# Patient Record
Sex: Male | Born: 1980 | Marital: Single | Smoking: Never smoker
Health system: Southern US, Community
[De-identification: ages and names within clinical notes are randomized; demographics above are authoritative.]

---

## 2013-05-31 ENCOUNTER — Ambulatory Visit
Admission: RE | Admit: 2013-05-31 | Discharge: 2013-05-31 | Disposition: A | Discharge status: Home / Self Care | Payer: No Typology Code available for payment source | Source: Ambulatory Visit | Attending: Infectious Disease | Admitting: Infectious Disease

## 2013-05-31 ENCOUNTER — Other Ambulatory Visit: Payer: Self-pay | Admitting: Infectious Disease

## 2013-05-31 DIAGNOSIS — A150 Tuberculosis of lung: Secondary | ICD-10-CM

## 2014-04-04 ENCOUNTER — Ambulatory Visit
Admission: RE | Admit: 2014-04-04 | Discharge: 2014-04-04 | Disposition: A | Discharge status: Home / Self Care | Payer: No Typology Code available for payment source | Source: Ambulatory Visit | Attending: Infectious Disease | Admitting: Infectious Disease

## 2014-04-04 ENCOUNTER — Other Ambulatory Visit: Payer: Self-pay | Admitting: Infectious Disease

## 2014-04-04 DIAGNOSIS — A150 Tuberculosis of lung: Secondary | ICD-10-CM

## 2014-05-09 ENCOUNTER — Emergency Department (HOSPITAL_COMMUNITY)
Admission: EM | Admit: 2014-05-09 | Discharge: 2014-05-09 | Disposition: A | Discharge status: Home / Self Care | Payer: Self-pay | Attending: Emergency Medicine | Admitting: Emergency Medicine

## 2014-05-09 ENCOUNTER — Encounter (HOSPITAL_COMMUNITY): Payer: Self-pay | Admitting: *Deleted

## 2014-05-09 ENCOUNTER — Emergency Department (HOSPITAL_COMMUNITY): Payer: Self-pay

## 2014-05-09 DIAGNOSIS — M25461 Effusion, right knee: Secondary | ICD-10-CM | POA: Insufficient documentation

## 2014-05-09 DIAGNOSIS — M25462 Effusion, left knee: Secondary | ICD-10-CM | POA: Insufficient documentation

## 2014-05-09 DIAGNOSIS — R52 Pain, unspecified: Secondary | ICD-10-CM

## 2014-05-09 MED ORDER — HYDROCODONE-ACETAMINOPHEN 5-325 MG PO TABS: 1.0000 | ORAL_TABLET | Freq: Four times a day (QID) | ORAL | Status: AC | PRN

## 2014-05-09 NOTE — Discharge Instructions (Signed)
Derrame en la rodilla (Knee Effusion)  Un derrame en la rodilla significa que hay lquido en la misma. Puede ser que tenga dificultad para doblarla y [place]. CUIDADOS EN EL HOGAR  Utilice las muletas segn las indicaciones del mdico.  Coloque hielo sobre la zona lesionada.  Ponga el hielo en una bolsa plstica.  Colquese una toalla entre la piel y la bolsa de hielo.  Deje el hielo durante 15 a 20 minutos, 3 a 4 veces por da.  No permanezca de pie durante mucho tiempo.  Tome slo la medicacin que le indic el mdico.  Puede necesitar hacer ejercicios de fortalecimiento. Consulte a su mdico.  Siga con la dieta y las [address] Northactividades normales, segn las indicaciones del mdico. SOLICITE AYUDA DE INMEDIATO SI:  Observa ms hinchazn (inflamacin) en la rodilla.  Presenta enrojecimiento, hinchazn o aumento del dolor.  La temperatura oral le sube a ms de 38,9 C (102 F).  Aparece una erupcin cutnea.  Tiene dificultades respiratorias.  Presenta alguna reaccin a los medicamentos que toma.  Siente mucho dolor al mover la rodilla. ASEGRESE DE QUE:  Comprende estas instrucciones.  Controlar su enfermedad.  Solicitar ayuda de inmediato si no mejora o empeora. Document Released: 08/03/2010 Document Revised: 07/11/2011 [facility] Patient Information 2015 [place], [place]. This information is not intended to replace advice given to you by your health care provider. Make sure you discuss any questions you have with your health care provider.

## 2014-05-09 NOTE — ED Notes (Signed)
PT DOES NOT SPEAK ENGLISH.

## 2014-05-09 NOTE — ED Notes (Signed)
Patient with pain both knees.  Patient denies trauma.  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  Patient does spend a lot of time working with knees bent

## 2014-05-09 NOTE — ED Provider Notes (Signed)
CSN: [id number]     Arrival date & time 05/09/14  0935 History  This chart was scribed for non-physician practitioner, Teressa LowerVrinda Oveta Idris, NP-C working with Arby BarretteMarcy Pfeiffer, MD by Luisa DagoPriscilla Tutu, ED scribe. This patient was seen in room TR06C/TR06C and the patient's care was started at 9:45 AM.    Chief Complaint  Patient presents with  . Knee Pain   The history is provided by the patient. A language interpreter was used.   HPI Comments: Jose Gonzalez is a 34 y.o. male who presents to the Emergency Department complaining of gradual onset bilateral knee swelling that started approximately 2 days ago. He is also complaining of associated pain. Denies taking any pills for pain but he does endorse applying a topical cream to bilateral knees. However, the cream did not provide him with any substantial relief. 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 Denies any pertinent medical hx, tobacco use, numbness, chills, fever, nausea, or abdominal pain.   History reviewed. No pertinent past medical history. History reviewed. No pertinent past surgical history. No family history on file. History  Substance Use Topics  . Smoking status: Never Smoker   . Smokeless tobacco: Not on file  . Alcohol Use: Yes    Review of Systems  Constitutional: Negative for fever and chills.  HENT: Negative for congestion.   Respiratory: Negative for shortness of breath.   Cardiovascular: Negative for chest pain.  Musculoskeletal: Positive for joint swelling and arthralgias.  Neurological: Negative for weakness and numbness.  All other systems reviewed and are negative.     Allergies  Review of patient's allergies indicates no known allergies.  Home Medications   Prior to Admission medications   Not on File   Triage Vitals:BP 152/90 mmHg  Pulse 92  Temp(Src) 99.2 F (37.3 C) (Oral)  Resp 18  SpO2 100%    Physical Exam  Constitutional: He is oriented to person,  place, and time. He appears well-developed and well-nourished. No distress.  HENT:  Head: Normocephalic and atraumatic.  Eyes: Conjunctivae and EOM are normal.  Neck: Neck supple. No tracheal deviation present.  Cardiovascular: Normal rate.   Pulmonary/Chest: Effort normal. No respiratory distress.  Musculoskeletal: Normal range of motion.  Full rom of knees bilaterally. No redness or warmth noted. Swelling noted.  Neurological: He is alert and oriented to person, place, and time.  Skin: Skin is warm and dry.  Psychiatric: He has a normal mood and affect. His behavior is normal.  Nursing note and vitals reviewed.   ED Course  Procedures (including critical care time)  DIAGNOSTIC STUDIES: Oxygen Saturation is 100% on RA, normal by my interpretation.    COORDINATION OF CARE: 9:58 AM-Will order X-rays of bilateral knees. Pt advised of plan for treatment and pt agrees.  Labs Review Labs Reviewed - No data to display  Imaging Review No results found.   EKG Interpretation None      MDM   Final diagnoses:  Pain  Bilateral knee effusions    Pt has full rom. Likely related to constant abuse of knees. Will give script for vicodin and have follow up with ortho  I personally performed the services described in this documentation, which was scribed in my presence. The recorded information has been reviewed and is accurate.    Teressa LowerVrinda Xzavion Doswell, NP 05/09/14 1105  Arby BarretteMarcy Pfeiffer, MD 05/11/14 [phone]

## 2015-05-15 DIAGNOSIS — M25461 Effusion, right knee: Secondary | ICD-10-CM | POA: Insufficient documentation

## 2015-05-15 DIAGNOSIS — M25561 Pain in right knee: Secondary | ICD-10-CM | POA: Insufficient documentation

## 2015-05-16 ENCOUNTER — Emergency Department (HOSPITAL_COMMUNITY)
Admission: EM | Admit: 2015-05-16 | Discharge: 2015-05-16 | Disposition: A | Discharge status: Home / Self Care | Payer: Self-pay | Attending: Emergency Medicine | Admitting: Emergency Medicine

## 2015-05-16 ENCOUNTER — Encounter (HOSPITAL_COMMUNITY): Payer: Self-pay | Admitting: *Deleted

## 2015-05-16 ENCOUNTER — Emergency Department (HOSPITAL_COMMUNITY): Payer: Self-pay

## 2015-05-16 DIAGNOSIS — M25561 Pain in right knee: Secondary | ICD-10-CM

## 2015-05-16 MED ORDER — IBUPROFEN 800 MG PO TABS: 800.0000 mg | ORAL_TABLET | Freq: Three times a day (TID) | ORAL | Status: AC

## 2015-05-16 MED ORDER — ACETAMINOPHEN 325 MG PO TABS
650.0000 mg | ORAL_TABLET | Freq: Once | ORAL | Status: AC
  Administered 2015-05-16: 650 mg via ORAL
  Filled 2015-05-16: qty 2

## 2015-05-16 MED ORDER — IBUPROFEN 400 MG PO TABS
800.0000 mg | ORAL_TABLET | Freq: Once | ORAL | Status: AC
  Administered 2015-05-16: 800 mg via ORAL
  Filled 2015-05-16: qty 2

## 2015-05-16 NOTE — Discharge Instructions (Signed)
Dolor de rodilla (Knee Pain) El dolor de rodilla es un sntoma muy comn y puede tener muchas causas. Suele desaparecer cuando se siguen las indicaciones del mdico en lo que respecta a aliviar el dolor y las molestias en la casa. Sin embargo, puede progresar hasta convertirse en una afeccin que requiere tratamiento. Algunas afecciones pueden incluir lo siguiente:  Artritis por uso y desgaste (artrosis).  Artritis por hinchazn e irritacin (artritis reumatoide o gota).  Un quiste o un crecimiento en la rodilla.  Una infeccin en la articulacin de la rodilla.  Una lesin que no se cura.  Dao, hinchazn o irritacin de los tejidos que sostienen la rodilla (distensin de ligamentos o tendinitis). Si el dolor de rodilla persiste, tal vez haya que realizar ms estudios para diagnosticar la afeccin, los cuales pueden incluir radiografas u otros estudios de diagnstico por imgenes de la rodilla. Adems, es posible que haya que extraerle lquido de la rodilla. El tratamiento del dolor continuo de rodilla depende de la causa, pero puede incluir lo siguiente:  Medicamentos para aliviar el dolor o reducir la hinchazn.  Inyecciones de corticoides en la rodilla.  Fisioterapia.  Ciruga. INSTRUCCIONES PARA EL CUIDADO EN EL HOGAR  Tome los medicamentos solamente como se lo haya indicado el mdico.  Mantenga la rodilla en reposo y en alto (elevada) mientras est descansando.  No haga cosas que le causen dolor o que lo intensifiquen.  Evite las actividades o los ejercicios de alto impacto, como correr, saltar la soga o hacer saltos de tijera.  Aplique hielo sobre la zona de la rodilla:  Ponga el hielo en una bolsa plstica.  Coloque una toalla entre la piel y la bolsa de hielo.  Coloque el hielo durante 20 minutos, 2 a 3 veces por da.  Pregntele al mdico si debe usar una rodillera elstica.  Cuando duerma, pngase una almohada debajo de la rodilla.  Baje de peso si es  necesario. El peso extra puede generar presin en la rodilla.  No consuma ningn producto que contenga tabaco, lo que incluye cigarrillos, tabaco de mascar o cigarrillos electrnicos. Si necesita ayuda para dejar de fumar, consulte al mdico. Fumar puede retrasar la curacin de cualquier problema que tenga en el hueso y la articulacin. SOLICITE ATENCIN MDICA SI:  El dolor de rodilla contina, cambia o empeora.  Tiene fiebre junto con dolor de rodilla.  La rodilla se le tuerce o se le traba.  La rodilla est ms hinchada. SOLICITE ATENCIN MDICA DE INMEDIATO SI:   La articulacin de la rodilla est caliente al tacto.  Tiene dolor en el pecho o dificultad para respirar.   Esta informacin no tiene como fin reemplazar el consejo del mdico. Asegrese de hacerle al mdico cualquier pregunta que tenga.   Document Released: 10/05/2007 Document Revised: 05/09/2014 Elsevier Interactive Patient Education 2016 Elsevier Inc.  

## 2015-05-16 NOTE — ED Notes (Signed)
The pt is c/o rt knee pain and swelling for 3 days.  No known injury.  He has had fluid drawn off the knee in the past.  He has an appointment with a ortho doctor on Monday but his knee hurt too much to wait.

## 2015-05-16 NOTE — ED Provider Notes (Signed)
CSN: [id number]     Arrival date & time 05/15/15  2355 History   First MD Initiated Contact with Patient 05/16/15 0007     Chief Complaint  Patient presents with  . Knee Pain     (Consider location/radiation/quality/duration/timing/severity/associated sxs/prior Treatment) HPI   Jose Gonzalez is a 35 y.o. male with no significant PMH who presents with 3 day hx of constant, moderate, gradually worsening right knee pain and swelling.  He reports taking 6-8 tablets of Advil throughout the day.  Aggravating factors include bearing weight.  No prior injury in the past.  No injury/trauma.  He reports he has had fluid drawn off the knee in the past.  He reports he has an appointment on Monday with his orthopedic doctor.  Denies numbness, paresthesias, or weakness.  No fevers or chills.    History reviewed. No pertinent past medical history. History reviewed. No pertinent past surgical history. No family history on file. Social History  Substance Use Topics  . Smoking status: Never Smoker   . Smokeless tobacco: None  . Alcohol Use: Yes    Review of Systems  All other systems negative unless otherwise stated in HPI   Allergies  Review of patient's allergies indicates no known allergies.  Home Medications   Prior to Admission medications   Medication Sig Start Date End Date Taking? Authorizing Provider  HYDROcodone-acetaminophen (NORCO/VICODIN) 5-325 MG per tablet Take 1-2 tablets by mouth every 6 (six) hours as needed. 05/09/14   Teressa Lower, NP  ibuprofen (ADVIL,MOTRIN) 800 MG tablet Take 1 tablet (800 mg total) by mouth 3 (three) times daily. 05/16/15   Jess Toney, PA-C   BP 151/98 mmHg  Pulse 84  Temp(Src) 99.7 F (37.6 C)  Resp 16  SpO2 97% Physical Exam  Constitutional: He is oriented to person, place, and time. He appears well-developed and well-nourished.  HENT:  Head: Atraumatic.  Eyes: Conjunctivae are normal. No scleral icterus.  Neck: No tracheal deviation present.   Cardiovascular:  Pulses:      Dorsalis pedis pulses are 2+ on the right side, and 2+ on the left side.  Pulmonary/Chest: Effort normal. No respiratory distress.  Musculoskeletal: He exhibits edema and tenderness.       Right knee: He exhibits decreased range of motion (secondary to pain) and swelling. He exhibits no effusion, no ecchymosis, no deformity and no erythema. Tenderness found.       Left knee: Normal.  Right knee: No erythema or warmth.  Decreased ROM secondary to pain.  Swelling noted.   Neurological: He is alert and oriented to person, place, and time.  Strength and sensation intact bilaterally throughout lower extremities.   Skin: Skin is warm and dry.  Psychiatric: He has a normal mood and affect. His behavior is normal.    ED Course  Procedures (including critical care time) Labs Review Labs Reviewed - No data to display  Imaging Review Dg Knee Complete 4 Views Right  05/16/2015  CLINICAL DATA:  35 year old male with right knee pain unable to straighten the leg EXAM: RIGHT KNEE - COMPLETE 4+ VIEW COMPARISON:  Radiograph dated 05/09/2014 FINDINGS: There is no evidence of fracture, dislocation, or joint effusion. There is no evidence of arthropathy or other focal bone abnormality. Soft tissues are unremarkable. IMPRESSION: Negative. Electronically Signed   By: Elgie Collard M.D.   On: 05/16/2015 01:23   I have personally reviewed and evaluated these images and lab results as part of my medical decision-making.   EKG Interpretation  None      MDM   Final diagnoses:  Right knee pain   Patient presents with right knee pain x 3 days.  No injury/trauma.  Assoc swelling.  NVI.  Right knee with swelling.  Will give motrin and tylenol for pain.  Plain films show no acute abnormality. Will give knee immobilizer and crutches. Recommend follow up appointment on Monday with his orthopedist. Dicussed return precautions.  Patient agrees and acknowledges the above plan for  discharge.  Filed Vitals:   05/16/15 0012  BP: 151/98  Pulse: 84  Temp: 99.7 F (37.6 C)  Resp: 16    Meds given in ED:  Medications  ibuprofen (ADVIL,MOTRIN) tablet 800 mg (not administered)  acetaminophen (TYLENOL) tablet 650 mg (not administered)    New Prescriptions   IBUPROFEN (ADVIL,MOTRIN) 800 MG TABLET    Take 1 tablet (800 mg total) by mouth 3 (three) times daily.        Cheri FowlerKayla Nikeisha Klutz, PA-C 05/16/15 0201  Devoria AlbeIva Knapp, MD 05/16/15 [phone]

## 2017-03-03 IMAGING — CR DG KNEE COMPLETE 4+V*R*
4 series · 4 of 4 positions shown · non-contrast
Comparison: Radiograph dated 05/09/2014

CLINICAL DATA: 34-year-old male with right knee pain unable to
straighten the leg

EXAM:
RIGHT KNEE - COMPLETE 4+ VIEW

[knee ap]
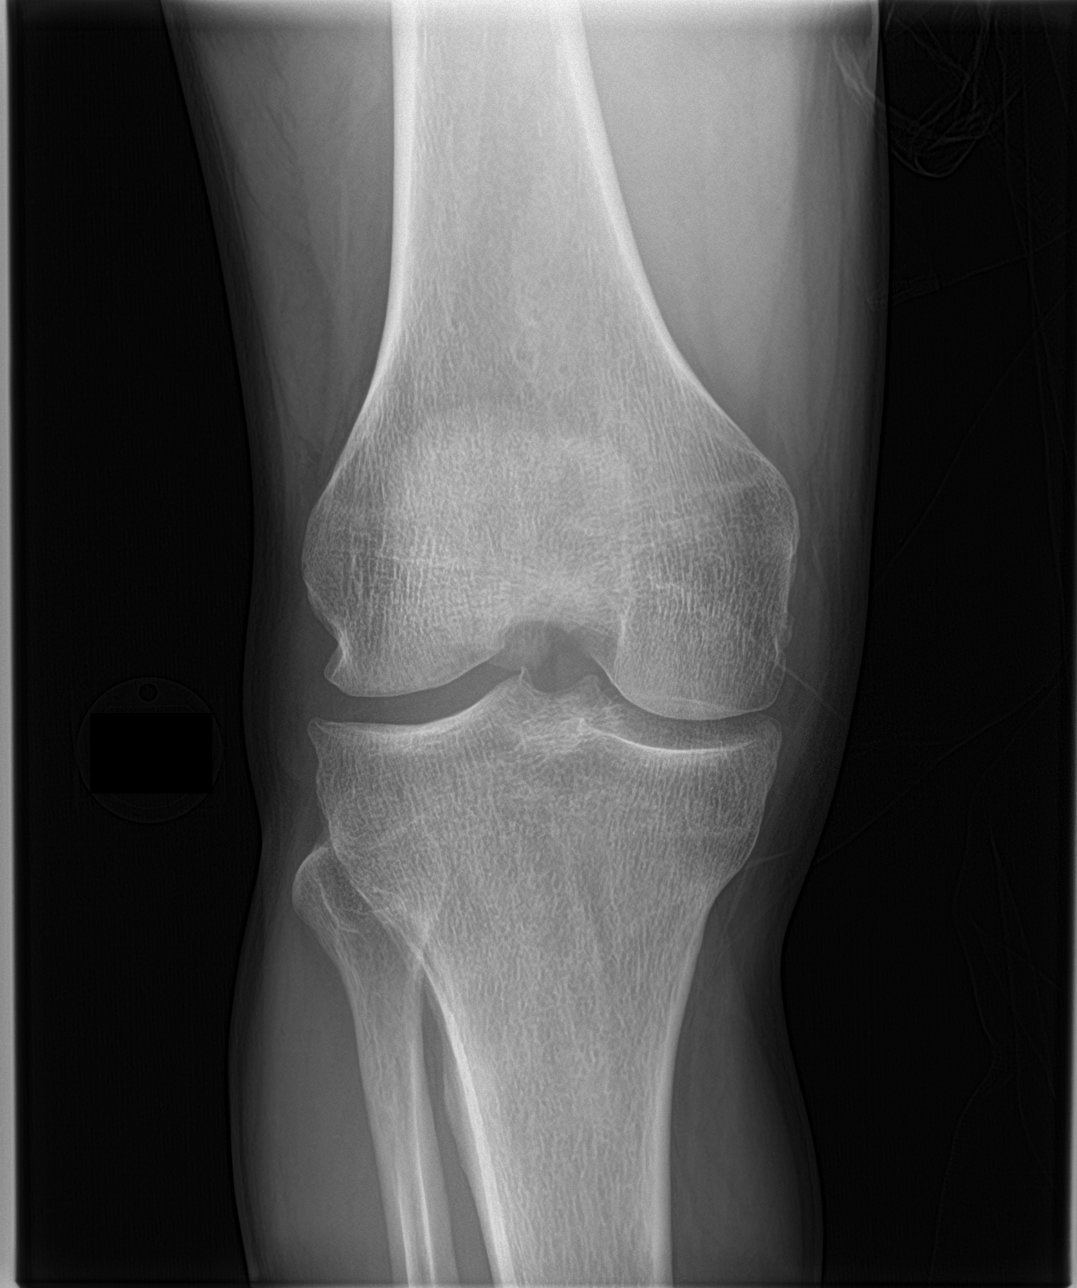

[knee lat]
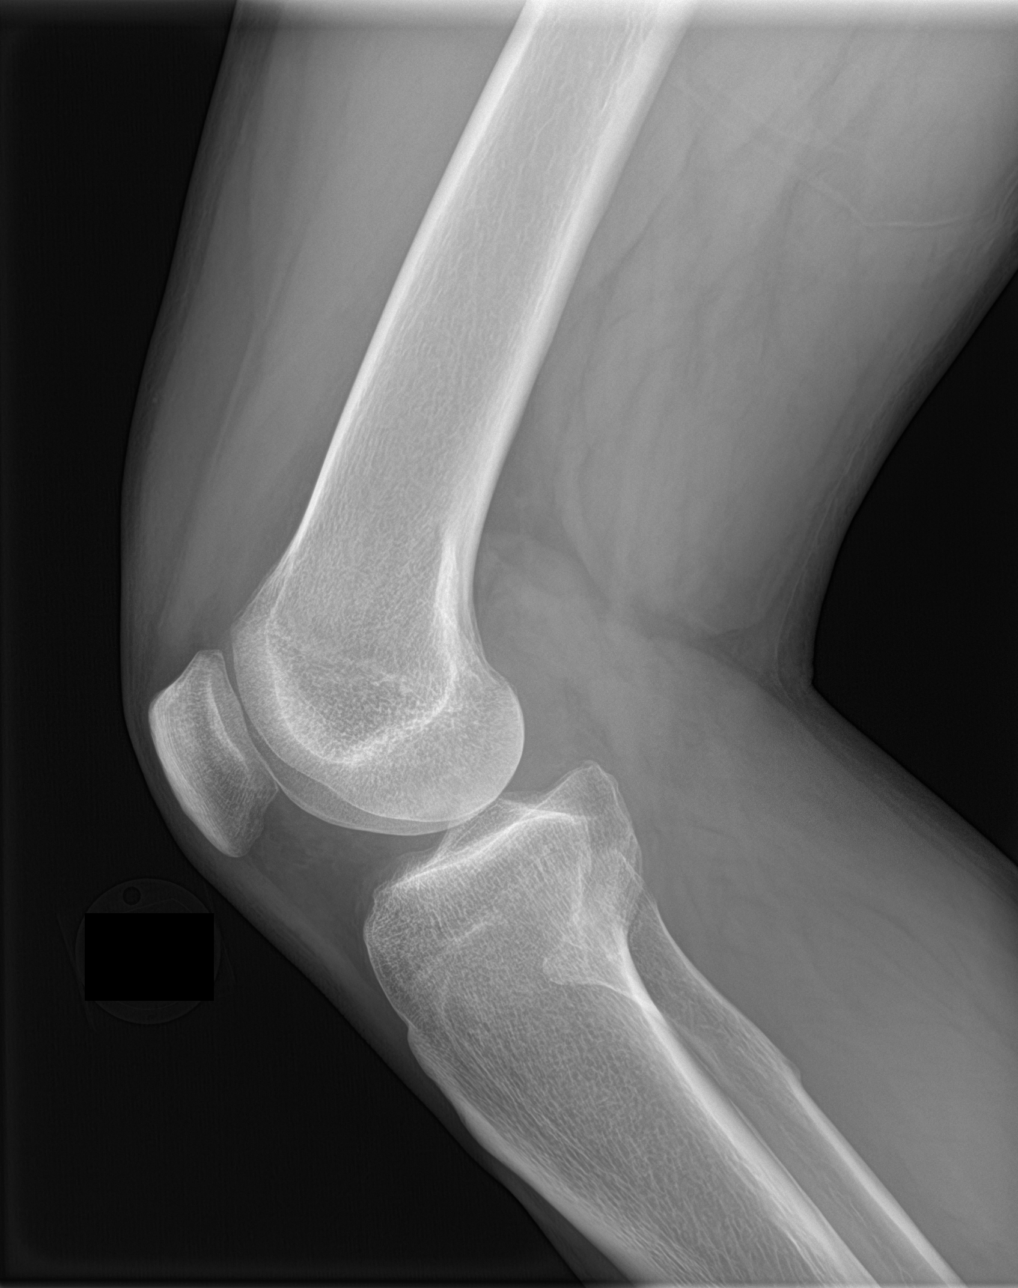

[knee obl (1 of 2)]
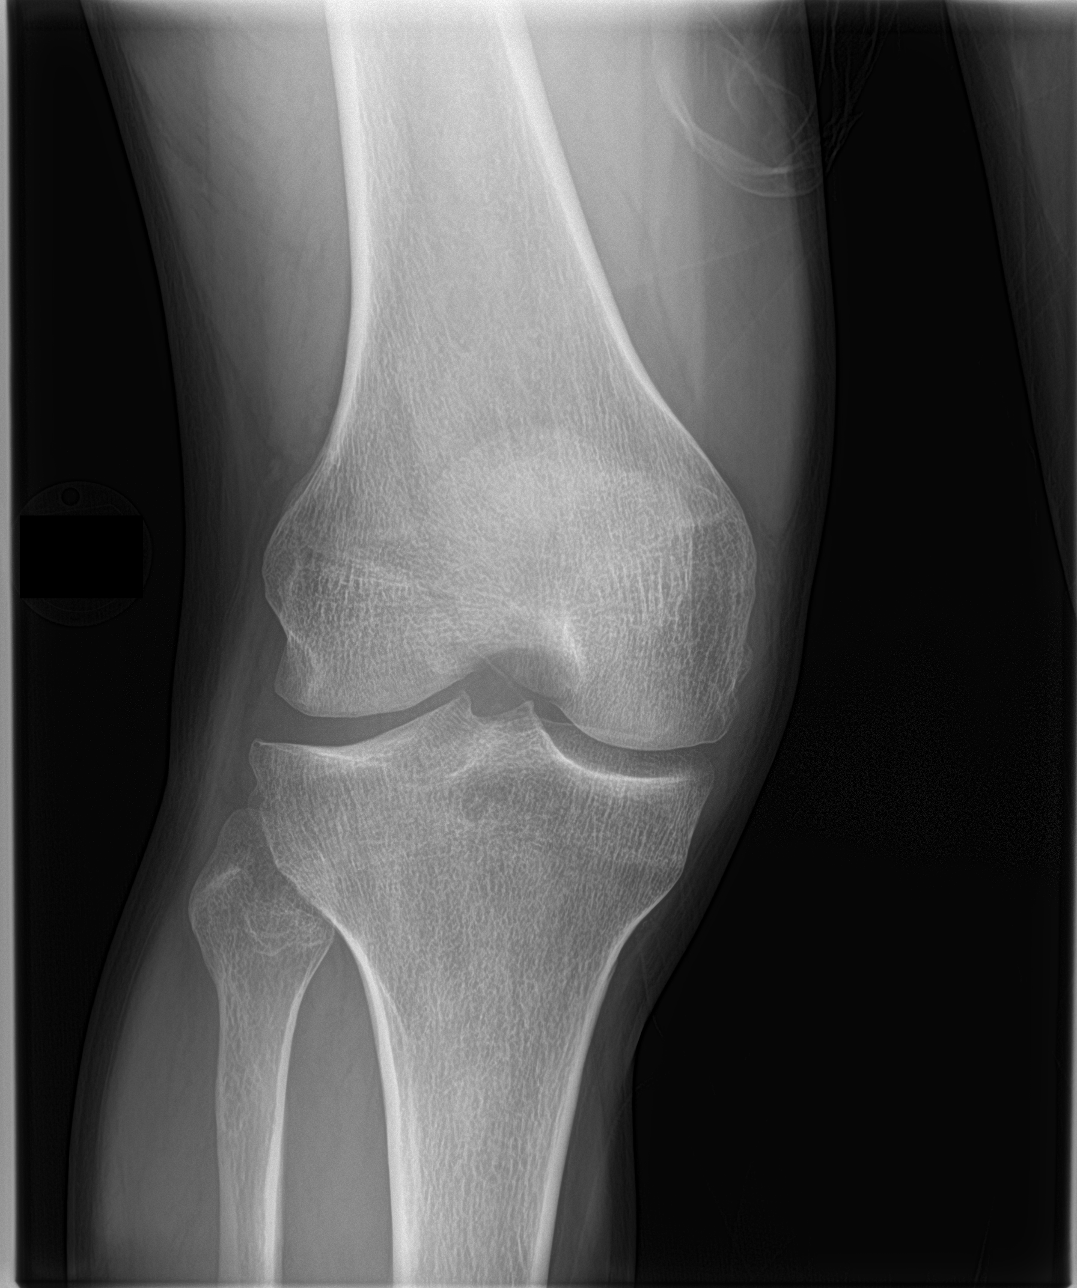

[knee obl (2 of 2)]
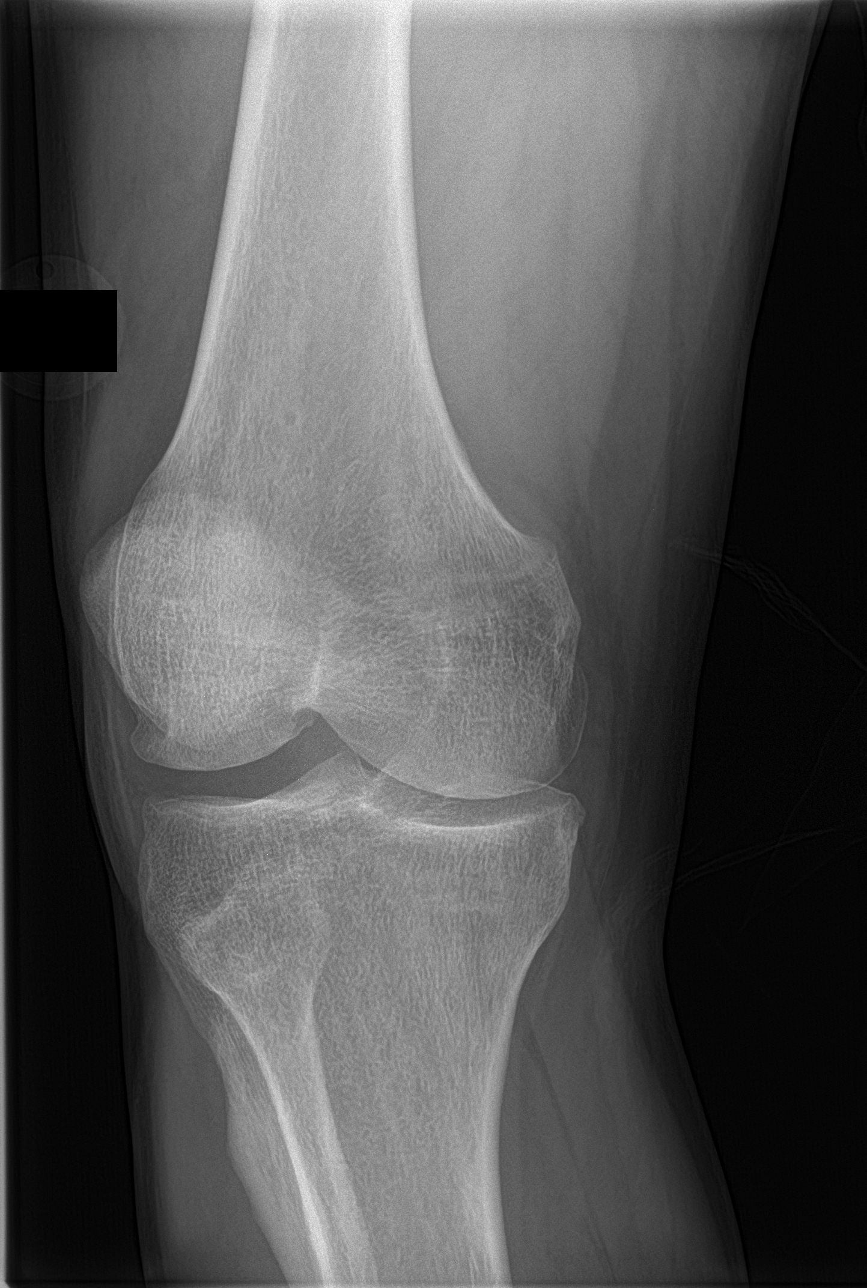

[4 of 4 positions shown; findings below may reference images not displayed]

FINDINGS: There is no evidence of fracture, dislocation, or joint effusion.
There is no evidence of arthropathy or other focal bone abnormality.
Soft tissues are unremarkable.
IMPRESSION: Negative.

## 2022-12-10 ENCOUNTER — Emergency Department (HOSPITAL_COMMUNITY): Payer: Self-pay

## 2022-12-10 ENCOUNTER — Emergency Department (HOSPITAL_COMMUNITY)
Admission: EM | Admit: 2022-12-10 | Discharge: 2022-12-10 | Disposition: A | Discharge status: Home / Self Care | Payer: Self-pay | Attending: Emergency Medicine | Admitting: Emergency Medicine

## 2022-12-10 ENCOUNTER — Other Ambulatory Visit: Payer: Self-pay

## 2022-12-10 DIAGNOSIS — G8929 Other chronic pain: Secondary | ICD-10-CM | POA: Insufficient documentation

## 2022-12-10 DIAGNOSIS — M25571 Pain in right ankle and joints of right foot: Secondary | ICD-10-CM | POA: Insufficient documentation

## 2022-12-10 MED ORDER — INDOMETHACIN 50 MG PO CAPS: 50.0000 mg | ORAL_CAPSULE | Freq: Two times a day (BID) | ORAL | 0 refills | Status: AC

## 2022-12-10 NOTE — Discharge Instructions (Addendum)
Use the anti-inflammatories for pain control.  Follow-up with the sports doctor if your ankle continues to hurt.

## 2022-12-10 NOTE — ED Notes (Signed)
Pt ambulated to hall bed with visitor independently and in NAD

## 2022-12-10 NOTE — ED Triage Notes (Signed)
Pt came in via POV d/t Rt leg pain for the past few days from his knee down to his foot. A/Ox4, rates pain 10/10, denies any recent falls or injuries.

## 2022-12-10 NOTE — ED Provider Notes (Signed)
[place] EMERGENCY DEPARTMENT AT [facility] Provider Note   CSN: [id number] Arrival date & time: 12/10/22  1322     History  Chief Complaint  Patient presents with   Leg Pain    Jose Gonzalez is a 42 y.o. male.  Patient is a 42 year old male with no known medical history who is presenting today with ongoing pain in his right ankle.  For over 10 years now he has had waxing and waning pain in this ankle.  He denies any known injury in the past.  This time his ankle has been hurting for multiple days and has been mildly swollen.  He has in the past been told he had gout and he had been on some medication for a while but is not on anything consistently.  He denies any recent trauma.  No pain in his toes foot but sometimes the pain in the ankle radiates up the leg.  The history is provided by the patient. The history is limited by a language barrier. A language interpreter was used.  Leg Pain      Home Medications Prior to Admission medications   Medication Sig Start Date End Date Taking? Authorizing Provider  HYDROcodone-acetaminophen (NORCO/VICODIN) 5-325 MG per tablet Take 1-2 tablets by mouth every 6 (six) hours as needed. 05/09/14   Teressa Lower, NP  ibuprofen (ADVIL,MOTRIN) 800 MG tablet Take 1 tablet (800 mg total) by mouth 3 (three) times daily. 05/16/15   Cheri Fowler, PA-C  indomethacin (INDOCIN) 50 MG capsule Take 1 capsule (50 mg total) by mouth 2 (two) times daily with a meal. 12/10/22  Yes Gwyneth Sprout, MD      Allergies    Patient has no known allergies.    Review of Systems   Review of Systems  Physical Exam Updated Vital Signs BP (!) 149/91 (BP Location: Right Arm)   Pulse 84   Temp 98.6 F (37 C) (Oral)   Resp 19   Ht 5\' 6"  (1.676 m)   Wt 81.6 kg   SpO2 100%   BMI 29.05 kg/m  Physical Exam Vitals reviewed.  Cardiovascular:     Pulses: Normal pulses.  Pulmonary:     Effort: Pulmonary effort is normal.  Musculoskeletal:         General: Tenderness present.     Right ankle: Swelling present. No ecchymosis. Tenderness present over the lateral malleolus. Normal range of motion.  Skin:    General: Skin is warm and dry.  Neurological:     Mental Status: He is alert. Mental status is at baseline.     ED Results / Procedures / Treatments   Labs (all labs ordered are listed, but only abnormal results are displayed) Labs Reviewed - No data to display  EKG None  Radiology DG Tibia/Fibula Right  Result Date: 12/10/2022 CLINICAL DATA:  Right centralized lateral lower extremity pain EXAM: RIGHT TIBIA AND FIBULA - 2 VIEW COMPARISON:  None Available. FINDINGS: Old healed fracture deformity of the mid fibular diaphysis. Slightly more inferiorly, there is subtle lateral distal fibular diaphyseal cortical outpouching. Soft tissues are unremarkable. IMPRESSION: 1. Old healed fracture deformity of the mid fibular diaphysis. 2. Possible stress-related changes of the slightly more distal lateral fibula diaphysis. Electronically Signed   By: Agustin Cree M.D.   On: 12/10/2022 14:24    Procedures Procedures    Medications Ordered in ED Medications - No data to display  ED Course/ Medical Decision Making/ A&P  Medical Decision Making Amount and/or Complexity of Data Reviewed Radiology: ordered.  Risk Prescription drug management.   Patient presenting today with swelling and pain in his right ankle that is been off and on for 10 years.  He has been told he had gout in the past but does not take any medication regularly.  Patient has no erythema but does have some mild warmth to the joint.  No findings to suggest septic arthritis.  No involvement of the toes and normal pulse.  Patient denies any trauma.  I have independently visualized and interpreted pt's images today.  X-ray today of the right ankle without obvious signs of fracture.  Radiology reports an old healed fracture deformity of the mid  fibular diaphysis and possible stress related changes of slightly more distal lateral fibula diaphysis.  Patient's pain is mostly in the lateral malleolus and ankle.  Will treat with anti-inflammatories and he was given sports medicine follow-up.         Final Clinical Impression(s) / ED Diagnoses Final diagnoses:  Chronic pain of right ankle    Rx / DC Orders ED Discharge Orders          Ordered    indomethacin (INDOCIN) 50 MG capsule  2 times daily with meals        12/10/22 1610              Gwyneth Sprout, MD 12/10/22 1615
